# Patient Record
Sex: Female | Born: 1995 | Race: White | Hispanic: No | Marital: Single | Smoking: Never smoker
Health system: Southern US, Community
[De-identification: ages and names within clinical notes are randomized; demographics above are authoritative.]

## PROBLEM LIST (undated history)

## (undated) DIAGNOSIS — G43909 Migraine, unspecified, not intractable, without status migrainosus: Secondary | ICD-10-CM

## (undated) DIAGNOSIS — E669 Obesity, unspecified: Secondary | ICD-10-CM

---
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
## 2015-03-04 ENCOUNTER — Emergency Department
Admission: EM | Admit: 2015-03-04 | Discharge: 2015-03-04 | Disposition: A | Discharge status: Home / Self Care | Payer: Medicaid Other | Attending: Emergency Medicine | Admitting: Emergency Medicine

## 2015-03-04 DIAGNOSIS — Z880 Allergy status to penicillin: Secondary | ICD-10-CM | POA: Insufficient documentation

## 2015-03-04 DIAGNOSIS — B349 Viral infection, unspecified: Secondary | ICD-10-CM | POA: Insufficient documentation

## 2015-03-04 DIAGNOSIS — Z3202 Encounter for pregnancy test, result negative: Secondary | ICD-10-CM | POA: Insufficient documentation

## 2015-03-04 DIAGNOSIS — J029 Acute pharyngitis, unspecified: Secondary | ICD-10-CM | POA: Diagnosis present

## 2015-03-04 LAB — URINALYSIS COMPLETE WITH MICROSCOPIC (ARMC ONLY)
Bilirubin Urine: NEGATIVE
Glucose, UA: NEGATIVE mg/dL
Hgb urine dipstick: NEGATIVE
NITRITE: NEGATIVE
PH: 5 (ref 5.0–8.0)
PROTEIN: NEGATIVE mg/dL
Specific Gravity, Urine: 1.02 (ref 1.005–1.030)

## 2015-03-04 LAB — POCT RAPID STREP A: STREPTOCOCCUS, GROUP A SCREEN (DIRECT): NEGATIVE

## 2015-03-04 LAB — PREGNANCY, URINE: Preg Test, Ur: NEGATIVE

## 2015-03-04 MED ORDER — ACETAMINOPHEN 325 MG PO TABS
650.0000 mg | ORAL_TABLET | Freq: Once | ORAL | Status: AC | PRN
  Administered 2015-03-04: 650 mg via ORAL
  Filled 2015-03-04: qty 2

## 2015-03-04 MED ORDER — ONDANSETRON HCL 4 MG PO TABS: 4.0000 mg | ORAL_TABLET | Freq: Three times a day (TID) | ORAL | Status: DC | PRN

## 2015-03-04 MED ORDER — ONDANSETRON 4 MG PO TBDP
4.0000 mg | ORAL_TABLET | Freq: Once | ORAL | Status: AC
  Administered 2015-03-04: 4 mg via ORAL

## 2015-03-04 MED ORDER — ACETAMINOPHEN-CODEINE #3 300-30 MG PO TABS
2.0000 | ORAL_TABLET | Freq: Once | ORAL | Status: AC
  Administered 2015-03-04: 2 via ORAL
  Filled 2015-03-04: qty 2

## 2015-03-04 MED ORDER — ONDANSETRON 4 MG PO TBDP
ORAL_TABLET | ORAL | Status: AC
  Administered 2015-03-04: 4 mg via ORAL
  Filled 2015-03-04: qty 1

## 2015-03-04 MED ORDER — ACETAMINOPHEN-CODEINE #3 300-30 MG PO TABS: 1.0000 | ORAL_TABLET | ORAL | Status: DC | PRN

## 2015-03-04 NOTE — ED Provider Notes (Signed)
[facility] Emergency Department Provider Note  ____________________________________________  Time seen: Approximately 11:02 PM  I have reviewed the triage vital signs and the nursing notes.   HISTORY  Chief Complaint Sore Throat   HPI Barbara Barry is a 19 y.o. female who presents to the emergency department for a 3-4 day history of sore throat, fever, nausea, and diarrhea. Fever of up to 103.8 today. She also states that she started Phentermine 6 days ago and wonders if the symptoms are due to an allergic reaction.   No past medical history on file.  There are no active problems to display for this patient.   No past surgical history on file.  Current Outpatient Rx  Name  Route  Sig  Dispense  Refill  . acetaminophen-codeine (TYLENOL #3) 300-30 MG tablet   Oral   Take 1-2 tablets by mouth every 4 (four) hours as needed for moderate pain.   12 tablet   0   . ondansetron (ZOFRAN) 4 MG tablet   Oral   Take 1 tablet (4 mg total) by mouth every 8 (eight) hours as needed for nausea or vomiting.   20 tablet   0     Allergies Penicillins  No family history on file.  Social History Social History  Substance Use Topics  . Smoking status: Not on file  . Smokeless tobacco: Not on file  . Alcohol Use: Not on file    Review of Systems Constitutional: Positive for fever. Eyes: No visual changes. ENT: Positive for sore throat; Negative for difficulty swallowing. Respiratory: Denies shortness of breath. Gastrointestinal: No abdominal pain.  Positive for nausea, negative for vomiting.  Positive for diarrhea.  Genitourinary: Negative for dysuria. Musculoskeletal: Positive for generalized body aches. Skin: Negative for rash. Neurological: Negative for headaches, focal weakness or numbness.  10-point ROS otherwise negative.  ____________________________________________   PHYSICAL EXAM:  VITAL SIGNS: ED Triage Vitals  Enc Vitals  Group     BP 03/04/15 2215 132/80 mmHg     Pulse Rate 03/04/15 2215 118     Resp 03/04/15 2215 20     Temp 03/04/15 2215 101.3 F (38.5 C)     Temp Source 03/04/15 2215 Oral     SpO2 03/04/15 2215 96 %     Weight 03/04/15 2215 339 lb (153.769 kg)     Height 03/04/15 2215  (1.676 m)     Head Cir --      Peak Flow --      Pain Score 03/04/15 2216 8     Pain Loc --      Pain Edu? --      Excl. in GC? --     Constitutional: Alert and oriented. Well appearing and in no acute distress. Eyes: Conjunctivae are normal. PERRL. EOMI. Head: Atraumatic. Nose: No congestion/rhinnorhea. Mouth/Throat: Mucous membranes are moist.  Oropharynx erythematous, without exudate. Neck: No stridor.  Lymphatic: Lymphadenopathy: not present Cardiovascular: Tachycardia. Good peripheral circulation. Respiratory: Normal respiratory effort. Lungs CTAB. Gastrointestinal: Soft and nontender. Musculoskeletal: No lower extremity tenderness nor edema.   Neurologic:  Normal speech and language. No gross focal neurologic deficits are appreciated. Speech is normal. No gait instability. Skin:  Skin is warm, dry and intact. No rash noted Psychiatric: Mood and affect are normal. Speech and behavior are normal.  ____________________________________________   LABS (all labs ordered are listed, but only abnormal results are displayed)  Labs Reviewed  URINALYSIS COMPLETEWITH MICROSCOPIC ([facility] ONLY) - Abnormal; Notable for the  following:    Color, Urine YELLOW (*)    APPearance CLEAR (*)    Ketones, ur TRACE (*)    Leukocytes, UA 1+ (*)    Bacteria, UA RARE (*)    Squamous Epithelial / LPF 0-5 (*)    All other components within normal limits  CULTURE, GROUP A STREP ([facility] ONLY)  PREGNANCY, URINE  POCT RAPID STREP A   ____________________________________________  EKG   ____________________________________________  RADIOLOGY  Not  indicated. ____________________________________________   PROCEDURES  Procedure(s) performed: None  Critical Care performed: No  ____________________________________________   INITIAL IMPRESSION / ASSESSMENT AND PLAN / ED COURSE  Pertinent labs & imaging results that were available during my care of the patient were reviewed by me and considered in my medical decision making (see chart for details).  Patient vomited after taking tylenol with codeine. Zofran given. Will monitor and re evaluate.  ----------------------------------------- 11:39 PM on 03/04/2015 -----------------------------------------  Patient tolerating fluids. Feeling better. No longer tachycardic. Temp down to 99.4. Will discharge home with Rx. For tylenol 3 and ondansetron. Will give diet instructions for diarrhea and vomiting. She is to follow up with PCP. She is to stop the phentermine until follow up with PCP. She was advised to return to the ER for symptoms that change or worsen or for new concerns if unable to see PCP. ____________________________________________   FINAL CLINICAL IMPRESSION(S) / ED DIAGNOSES  Final diagnoses:  Viral syndrome      Chinita PesterCari B Dain Laseter, FNP 03/04/15 2341  Chinita Pesterari B Percival Glasheen, FNP 03/04/15 2351  Arnaldo NatalPaul F Malinda, MD 03/05/15 0010

## 2015-03-04 NOTE — ED Notes (Addendum)
Pt in with co sore throat, body aches, dizziness, fever, and low back pain for 3 days.

## 2015-03-06 LAB — CULTURE, GROUP A STREP (THRC)

## 2015-03-07 NOTE — Progress Notes (Addendum)
ED Culture Results  Throat culture: positive for Group A Strep  Patient was not discharged on any antimirobials  Contacted MD Quale about starting antimicrobial therapy  MD would like to start Zpak in this patient.  Call patient however patient was not home. Left message with patient's mother for her to call me back @ her earliest convenience. Will need to get pharmacy information to call in RX for patient.    11/13: Patient did not answer phone call.   Demetrius Charityeldrin D. James, PharmD   11/15: Spoke to patient who wants antibiotic called in to [facility]. Called RX in at 1330.   Luisa HartScott Daimen Shovlin, PharmD

## 2015-09-27 ENCOUNTER — Emergency Department
Admission: EM | Admit: 2015-09-27 | Discharge: 2015-09-27 | Disposition: A | Discharge status: Home / Self Care | Payer: Medicaid Other | Attending: Emergency Medicine | Admitting: Emergency Medicine

## 2015-09-27 ENCOUNTER — Encounter: Payer: Self-pay | Admitting: Medical Oncology

## 2015-09-27 ENCOUNTER — Emergency Department: Payer: Medicaid Other

## 2015-09-27 DIAGNOSIS — R1031 Right lower quadrant pain: Secondary | ICD-10-CM | POA: Insufficient documentation

## 2015-09-27 DIAGNOSIS — Z79899 Other long term (current) drug therapy: Secondary | ICD-10-CM | POA: Diagnosis not present

## 2015-09-27 HISTORY — DX: Migraine, unspecified, not intractable, without status migrainosus: G43.909

## 2015-09-27 HISTORY — DX: Obesity, unspecified: E66.9

## 2015-09-27 LAB — URINALYSIS COMPLETE WITH MICROSCOPIC (ARMC ONLY)
BACTERIA UA: NONE SEEN
Bilirubin Urine: NEGATIVE
GLUCOSE, UA: NEGATIVE mg/dL
HGB URINE DIPSTICK: NEGATIVE
Ketones, ur: NEGATIVE mg/dL
NITRITE: NEGATIVE
PH: 6 (ref 5.0–8.0)
PROTEIN: NEGATIVE mg/dL
RBC / HPF: NONE SEEN RBC/hpf (ref 0–5)
SPECIFIC GRAVITY, URINE: 1.019 (ref 1.005–1.030)

## 2015-09-27 LAB — COMPREHENSIVE METABOLIC PANEL
ALBUMIN: 3.4 g/dL — AB (ref 3.5–5.0)
ALT: 17 U/L (ref 14–54)
ANION GAP: 5 (ref 5–15)
AST: 17 U/L (ref 15–41)
Alkaline Phosphatase: 65 U/L (ref 38–126)
BILIRUBIN TOTAL: 0.4 mg/dL (ref 0.3–1.2)
BUN: 13 mg/dL (ref 6–20)
CALCIUM: 8.7 mg/dL — AB (ref 8.9–10.3)
CO2: 24 mmol/L (ref 22–32)
Chloride: 110 mmol/L (ref 101–111)
Creatinine, Ser: 0.82 mg/dL (ref 0.44–1.00)
GFR calc non Af Amer: >60 mL/min (ref >60)
GLUCOSE: 123 mg/dL — AB (ref 65–99)
POTASSIUM: 3.9 mmol/L (ref 3.5–5.1)
SODIUM: 139 mmol/L (ref 135–145)
TOTAL PROTEIN: 6.6 g/dL (ref 6.5–8.1)

## 2015-09-27 LAB — CBC
HEMATOCRIT: 38.5 % (ref 35.0–47.0)
HEMOGLOBIN: 13.1 g/dL (ref 12.0–16.0)
MCH: 28.9 pg (ref 26.0–34.0)
MCHC: 34.1 g/dL (ref 32.0–36.0)
MCV: 84.8 fL (ref 80.0–100.0)
Platelets: 261 10*3/uL (ref 150–440)
RBC: 4.54 MIL/uL (ref 3.80–5.20)
RDW: 13.6 % (ref 11.5–14.5)
WBC: 8.4 10*3/uL (ref 3.6–11.0)

## 2015-09-27 LAB — LIPASE, BLOOD: Lipase: 22 U/L (ref 11–51)

## 2015-09-27 LAB — POCT PREGNANCY, URINE: PREG TEST UR: NEGATIVE

## 2015-09-27 MED ORDER — IBUPROFEN 600 MG PO TABS: 600.0000 mg | ORAL_TABLET | Freq: Once | ORAL | Status: DC

## 2015-09-27 NOTE — ED Provider Notes (Addendum)
Time Seen: Approximately 1840  I have reviewed the triage notes  Chief Complaint: Abdominal Pain   History of Present Illness: Barbara Barry is a 20 y.o. female who presents with approximately a 1 month history of intermittent right lower quadrant abdominal pain. She denies any fever at home. No nausea, vomiting, dysuria. States she does have some urinary frequency. She states she can the emergency department today because she was concerned that she may have a ruptured ovarian cyst. She denies any feelings of lightheadedness. She denies any current vaginal discharge or bleeding. States her menstrual periods have been normal up to this point she doesn't feel there is a risk that she's pregnant. Loose stool without any melena or hematochezia and states it really depends on her diet. Past Medical History  Diagnosis Date  . Obesity   . Migraines     There are no active problems to display for this patient.   History reviewed. No pertinent past surgical history.  History reviewed. No pertinent past surgical history.  Current Outpatient Rx  Name  Route  Sig  Dispense  Refill  . acetaminophen-codeine (TYLENOL #3) 300-30 MG tablet   Oral   Take 1-2 tablets by mouth every 4 (four) hours as needed for moderate pain.   12 tablet   0   . ondansetron (ZOFRAN) 4 MG tablet   Oral   Take 1 tablet (4 mg total) by mouth every 8 (eight) hours as needed for nausea or vomiting.   20 tablet   0     Allergies:  Penicillins  Family History: No family history on file.  Social History: Social History  Substance Use Topics  . Smoking status: Never Smoker   . Smokeless tobacco: None  . Alcohol Use: No     Review of Systems:   10 point review of systems was performed and was otherwise negative:  Constitutional: No fever Eyes: No visual disturbances ENT: No sore throat, ear pain Cardiac: No chest pain Respiratory: No shortness of breath, wheezing, or stridor Abdomen: No  abdominal pain, no vomiting, No diarrhea Endocrine: No weight loss, No night sweats Extremities: No peripheral edema, cyanosis Skin: No rashes, easy bruising Neurologic: No focal weakness, trouble with speech or swollowing Urologic: No dysuria, Hematuria, or urinary frequency   Physical Exam:  ED Triage Vitals  Enc Vitals Group     BP 09/27/15 1705 171/81 mmHg     Pulse Rate 09/27/15 1705 89     Resp 09/27/15 1705 20     Temp 09/27/15 1705 99 F (37.2 C)     Temp Source 09/27/15 1705 Oral     SpO2 09/27/15 1705 97 %     Weight 09/27/15 1705 339 lb (153.769 kg)     Height 09/27/15 1705 5\' 6"  (1.676 m)     Head Cir --      Peak Flow --      Pain Score 09/27/15 1705 8     Pain Loc --      Pain Edu? --      Excl. in GC? --     General: Awake , Alert , and Oriented times 3; GCS 15 Head: Normal cephalic , atraumatic Eyes: Pupils equal , round, reactive to light Nose/Throat: No nasal drainage, patent upper airway without erythema or exudate.  Neck: Supple, Full range of motion, No anterior adenopathy or palpable thyroid masses Lungs: Clear to ascultation without wheezes , rhonchi, or rales Heart: Regular rate, regular rhythm without murmurs ,  gallops , or rubs Abdomen: Morbidly obese Soft, non tender without rebound, guarding , or rigidity; bowel sounds positive and symmetric in all 4 quadrants. No organomegaly .        Extremities: 2 plus symmetric pulses. No edema, clubbing or cyanosis Neurologic: normal ambulation, Motor symmetric without deficits, sensory intact Skin: warm, dry, no rashes   Labs:   All laboratory work was reviewed including any pertinent negatives or positives listed below:  Labs Reviewed  COMPREHENSIVE METABOLIC PANEL - Abnormal; Notable for the following:    Glucose, Bld 123 (*)    Calcium 8.7 (*)    Albumin 3.4 (*)    All other components within normal limits  URINALYSIS COMPLETEWITH MICROSCOPIC ([facility] ONLY) - Abnormal; Notable for the following:     Color, Urine YELLOW (*)    APPearance CLEAR (*)    Leukocytes, UA TRACE (*)    Squamous Epithelial / LPF 0-5 (*)    All other components within normal limits  LIPASE, BLOOD  CBC  POC URINE PREG, ED  POCT PREGNANCY, URINE       Radiology:  EXAM: CT ABDOMEN AND PELVIS WITHOUT CONTRAST  TECHNIQUE: Multidetector CT imaging of the abdomen and pelvis was performed following the standard protocol without IV contrast.  COMPARISON: Lumbar spine CT dated 04/24/2014  FINDINGS: Evaluation of this exam is limited in the absence of intravenous contrast.  The visualized lung bases are clear. No intra-abdominal free air or free fluid.  The liver, gallbladder, pancreas, spleen, and the adrenal glands appear unremarkable. The kidneys, visualized ureters, and urinary bladder appear unremarkable. The uterus anteverted and grossly unremarkable. There is asymmetric prominence of the left ovary, likely containing a corpus luteum or a dominant cyst. Ultrasound may provide better evaluation of the pelvic structures. The right ovary appears unremarkable.  Evaluation of the bowel is limited in the absence of oral contrast. There is no evidence of bowel obstruction or active inflammation. The appendix is not visualized with certainty. No inflammatory changes identified in the right lower quadrant.  The abdominal aorta and IVC are grossly unremarkable on this noncontrast study. No portal venous gas identified. Small scattered mesenteric and retroperitoneal lymph nodes noted. There is no adenopathy. There is a midline vertical anterior pelvic wall incisional scar. The abdominal wall soft tissues are otherwise unremarkable. The osseous structures are intact.  IMPRESSION: No acute intra-abdominal or pelvic pathology.      I personally reviewed the radiologic studies    ED Course:  Differential diagnosis includes but is not exclusive to ovarian cyst, ovarian torsion, acute  appendicitis, urinary tract infection, endometriosis, bowel obstruction, colitis, renal colic, gastroenteritis, etc. Patient's stay here was uneventful and the patient otherwise remained hemodynamically stable. I felt this was unlikely to be a surgical abdomen at this time. Patient was advised take over-the-counter pain medication wasn't sure of the exact nature of her pain but I did not feel required any further objective studies or in-hospital management and consultation. Has follow-up with her primary physician was given acute unspecified abdominal pain instructions.    Assessment: * Acute unspecified abdominal pain  Final Clinical Impression:   Final diagnoses:  Right lower quadrant abdominal pain     Plan:  Outpatient management Patient was advised to return immediately if condition worsens. Patient was advised to follow up with their primary care physician or other specialized physicians involved in their outpatient care. The patient and/or family member/power of attorney had laboratory results reviewed at the bedside. All questions and concerns  were addressed and appropriate discharge instructions were distributed by the nursing staff.             Jennye Moccasin, MD 09/27/15 1933  Jennye Moccasin, MD 09/27/15 [id number]

## 2015-09-27 NOTE — Discharge Instructions (Signed)

## 2015-09-27 NOTE — ED Notes (Signed)
Pt reports RLQ abd pain x 1 month that has worsened over the last 2 days. Pt denies dysuria.

## 2015-09-27 NOTE — ED Notes (Signed)
Patient transported to CT 

## 2015-09-27 NOTE — ED Notes (Signed)
Pt in via triage with complaints of intermittent right pelvic pain x 1 month.  Pt concerned that it is her ovary.  Pt reports frequent urination.  Pt A/Ox4, no immediate distress at this time.

## 2016-08-04 ENCOUNTER — Other Ambulatory Visit: Payer: Self-pay | Admitting: Obstetrics and Gynecology

## 2016-08-04 ENCOUNTER — Telehealth: Payer: Self-pay | Admitting: Obstetrics and Gynecology

## 2016-08-04 NOTE — Telephone Encounter (Signed)
Pt's mother is calling about pt who was suppose to be sent to [facility] back on 05/20/16. Due to Pt having Medicaid referral was sent to [facility] [id number]. Pt was never contacted. Pt has contacted pcp [facility] and they do not have the referral. CB# [phone] GINA Mother

## 2016-08-04 NOTE — Telephone Encounter (Signed)
I spoke to the patient's mother Almira Coaster (on HIPAA in [place]) to let her know I could see where [place] [facility] submitted the referral in [facility] on 05/24/16, and the patient should call to have them follow-up with [facility]. Almira Coaster understands and agreed to have the patient call.

## 2016-08-08 NOTE — Telephone Encounter (Signed)
Please advise 

## 2016-08-09 NOTE — Telephone Encounter (Signed)
Pts mom aware to inform pt medication refill has been sent to pharmacy. KJ CMA

## 2018-05-08 IMAGING — CT CT RENAL STONE PROTOCOL
3 of 4 series · 9 of 46 positions shown, 16 images · non-contrast
Comparison: Lumbar spine CT dated 04/24/2014

CLINICAL DATA: 19-year-old female with right lower quadrant
abdominal pain.

EXAM:
CT ABDOMEN AND PELVIS WITHOUT CONTRAST
TECHNIQUE: Multidetector CT imaging of the abdomen and pelvis was performed
following the standard protocol without IV contrast.

[Series 4: lung · axial · 0.82mm/px · z∈[-202,-107]mm · 5 of 29 slices shown, 10 images]
[im 5/29  soft-tissue]
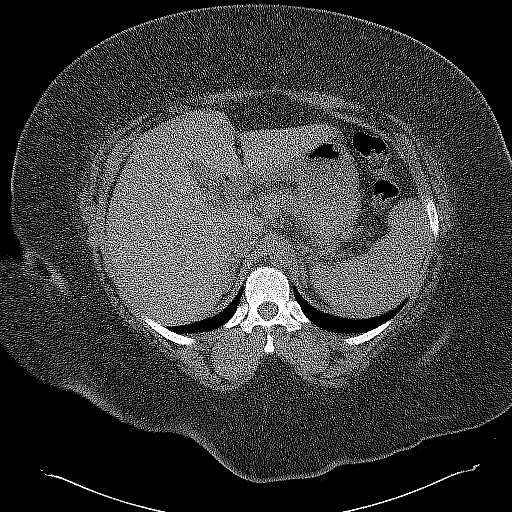
[im 5/29  bone]
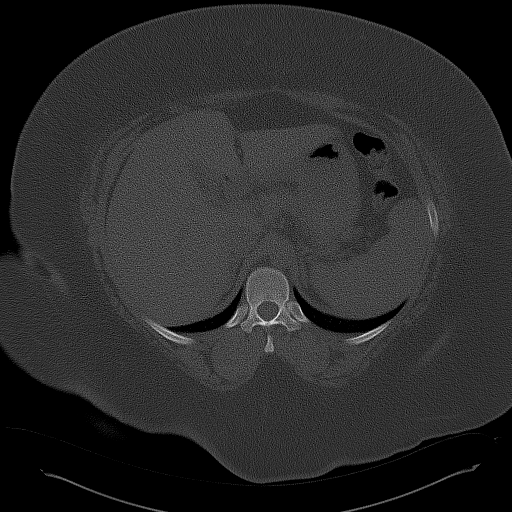
[im 10/29  soft-tissue]
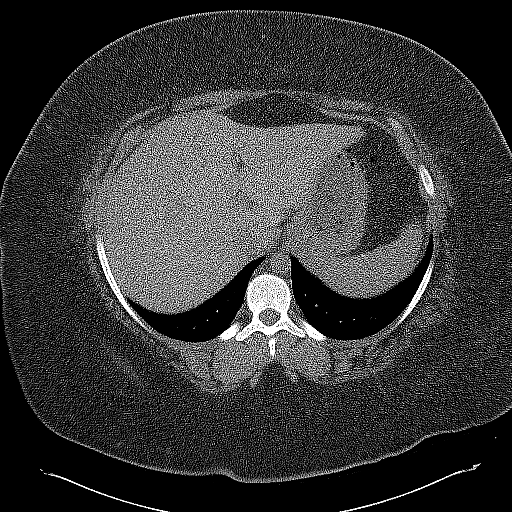
[im 10/29  lung]
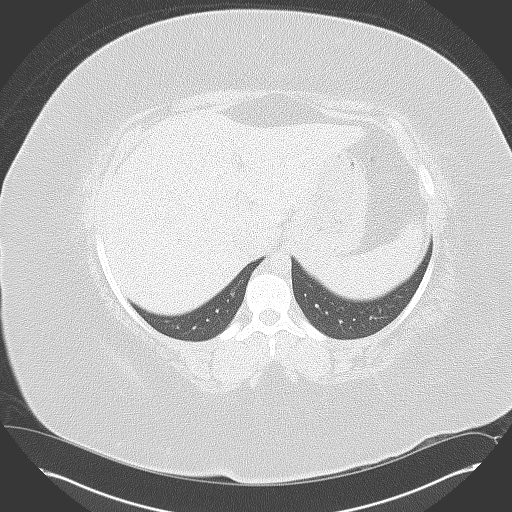
[im 15/29  soft-tissue]
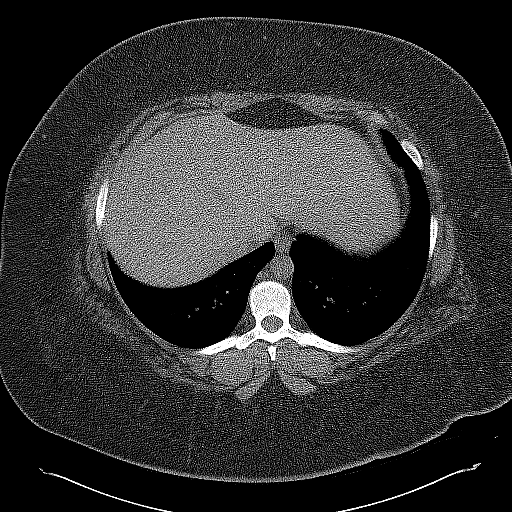
[im 15/29  lung]
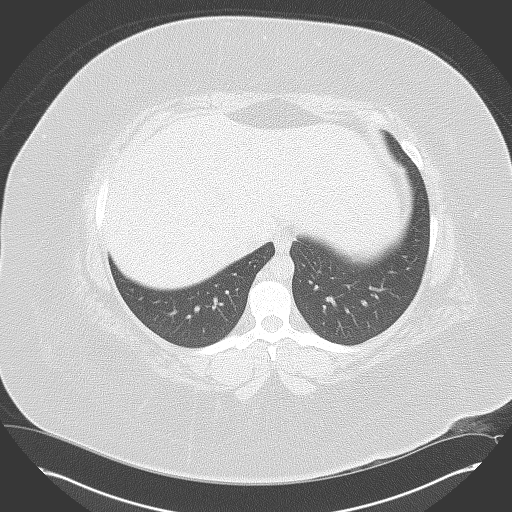
[im 19/29  soft-tissue]
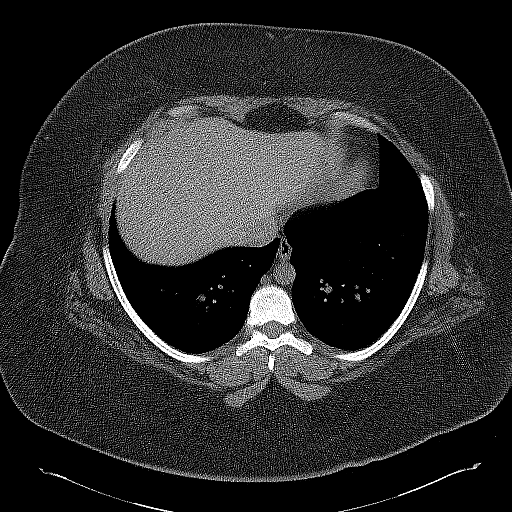
[im 19/29  lung]
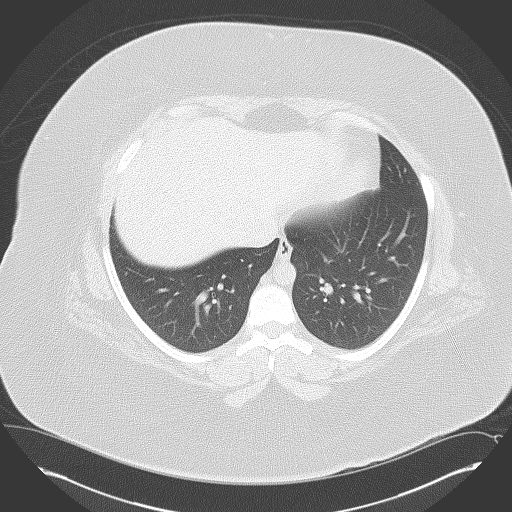
[im 24/29  soft-tissue]
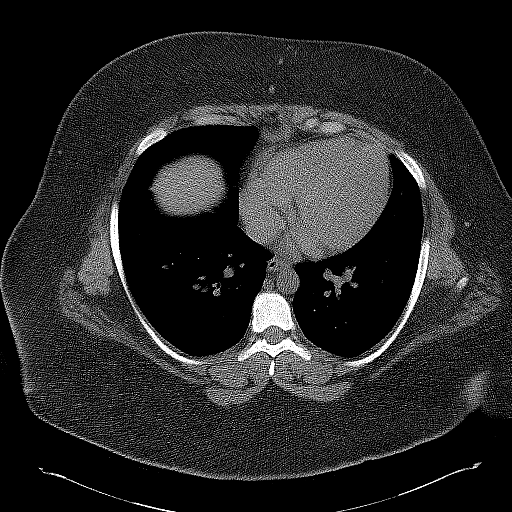
[im 24/29  lung]
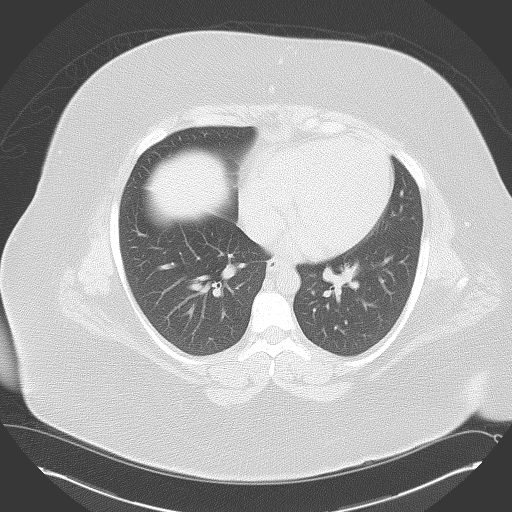

[Series 5: coronal · coronal · 0.75mm/px · 3 of 183 slices shown, 4 images]
[im 61/183  soft-tissue]
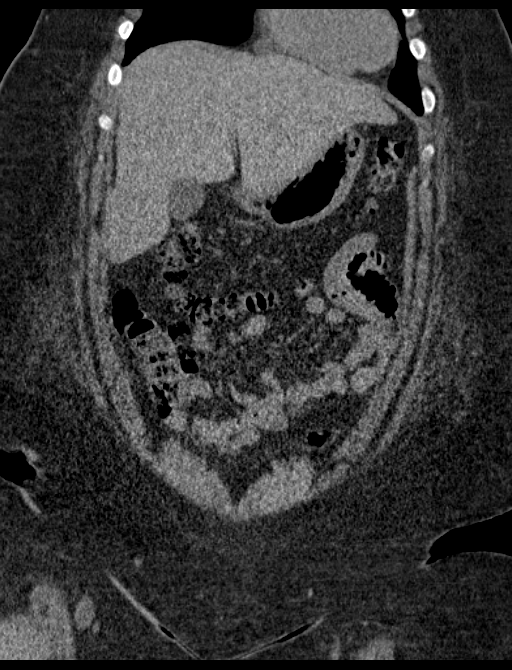
[im 81/183  soft-tissue]
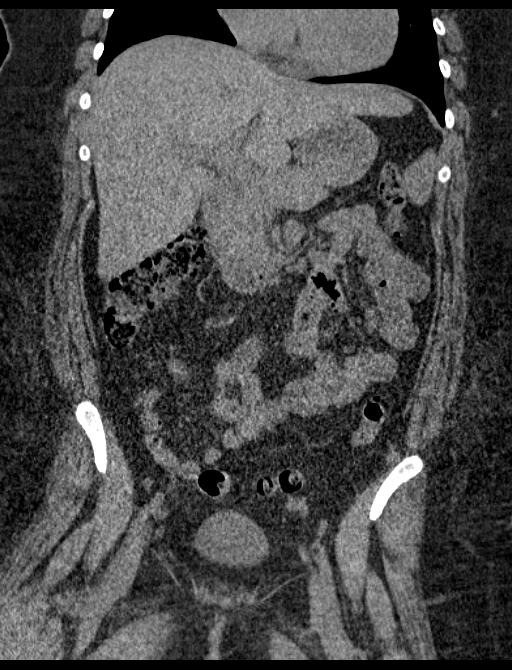
[im 81/183  bone]
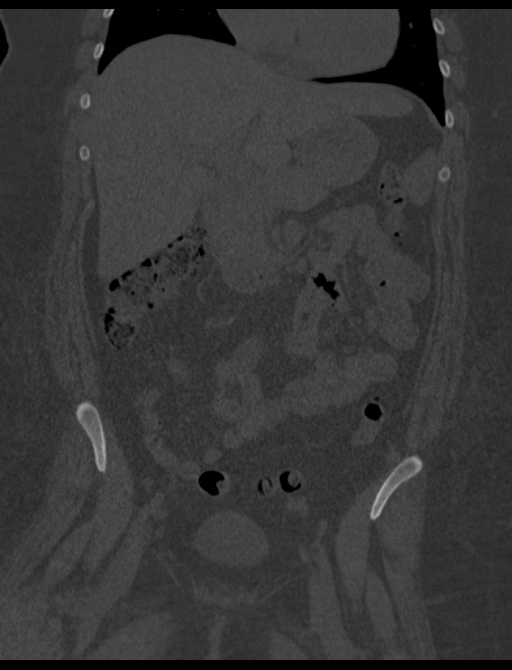
[im 102/183  soft-tissue]
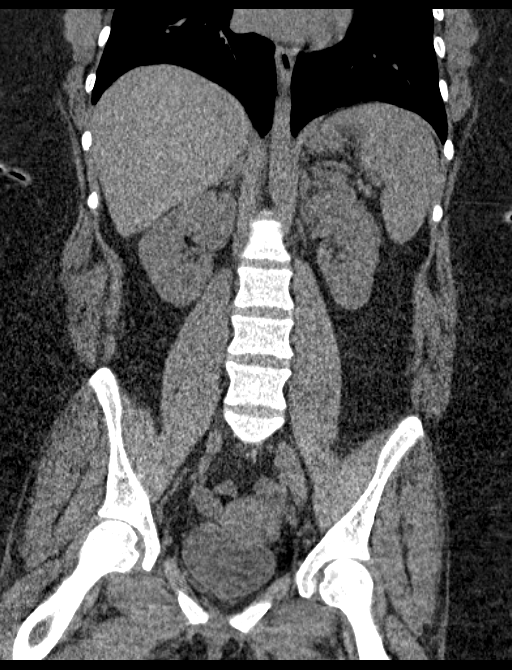

[Series 6: sagittal · sagittal · 0.71mm/px · 1 of 199 slices shown, 2 images]
[im 67/199  soft-tissue]
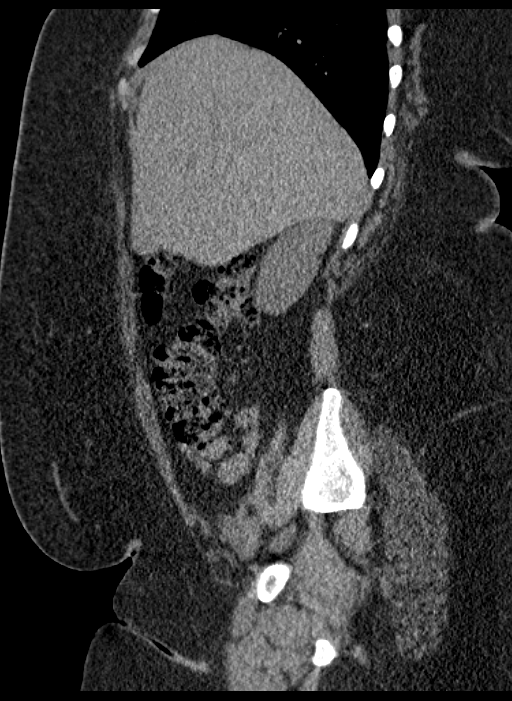
[im 67/199  bone]
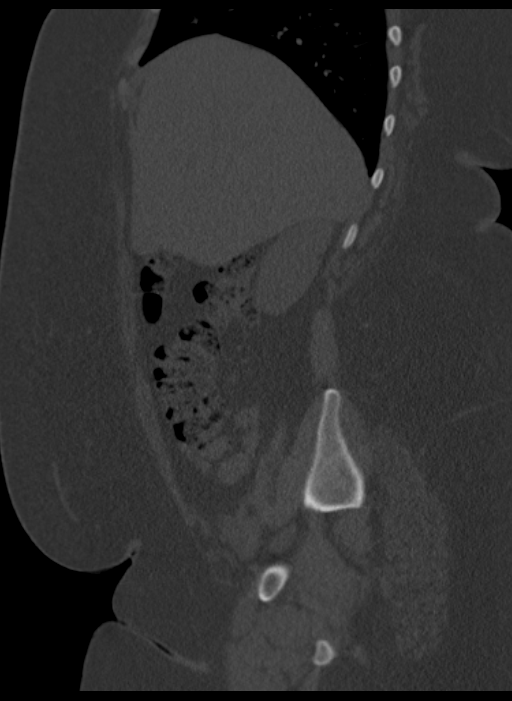

[9 of 46 positions shown; findings below may reference images not displayed]

FINDINGS: Evaluation of this exam is limited in the absence of intravenous
contrast.

The visualized lung bases are clear. No intra-abdominal free air or
free fluid.

The liver, gallbladder, pancreas, spleen, and the adrenal glands
appear unremarkable. The kidneys, visualized ureters, and urinary
bladder appear unremarkable. The uterus anteverted and grossly
unremarkable. There is asymmetric prominence of the left ovary,
likely containing a corpus luteum or a dominant cyst. Ultrasound may
provide better evaluation of the pelvic structures. The right ovary
appears unremarkable.

Evaluation of the bowel is limited in the absence of oral contrast.
There is no evidence of bowel obstruction or active inflammation.
The appendix is not visualized with certainty. No inflammatory
changes identified in the right lower quadrant.

The abdominal aorta and IVC are grossly unremarkable on this
noncontrast study. No portal venous gas identified. Small scattered
mesenteric and retroperitoneal lymph nodes noted. There is no
adenopathy. There is a midline vertical anterior pelvic wall
incisional scar. The abdominal wall soft tissues are otherwise
unremarkable. The osseous structures are intact.
IMPRESSION: No acute intra-abdominal or pelvic pathology.
